# Patient Record
Sex: Female | Born: 1960 | Race: White | Hispanic: No | Marital: Single | Smoking: Never smoker
Health system: Southern US, Community
[De-identification: ages and names within clinical notes are randomized; demographics above are authoritative.]

## PROBLEM LIST (undated history)

## (undated) DIAGNOSIS — E785 Hyperlipidemia, unspecified: Secondary | ICD-10-CM

## (undated) DIAGNOSIS — M797 Fibromyalgia: Secondary | ICD-10-CM

## (undated) DIAGNOSIS — E119 Type 2 diabetes mellitus without complications: Secondary | ICD-10-CM

## (undated) DIAGNOSIS — Z8739 Personal history of other diseases of the musculoskeletal system and connective tissue: Secondary | ICD-10-CM

## (undated) DIAGNOSIS — I10 Essential (primary) hypertension: Secondary | ICD-10-CM

## (undated) HISTORY — PX: CHOLECYSTECTOMY: SHX55

## (undated) HISTORY — PX: DILATION AND CURETTAGE OF UTERUS: SHX78

---

## 2017-08-14 ENCOUNTER — Other Ambulatory Visit: Payer: Self-pay

## 2017-08-14 ENCOUNTER — Encounter: Payer: Self-pay | Admitting: *Deleted

## 2017-08-14 ENCOUNTER — Emergency Department (INDEPENDENT_AMBULATORY_CARE_PROVIDER_SITE_OTHER): Payer: Medicare HMO

## 2017-08-14 ENCOUNTER — Emergency Department (INDEPENDENT_AMBULATORY_CARE_PROVIDER_SITE_OTHER)
Admission: EM | Admit: 2017-08-14 | Discharge: 2017-08-14 | Disposition: A | Discharge status: Home / Self Care | Payer: Medicare HMO | Source: Home / Self Care | Attending: Family Medicine | Admitting: Family Medicine

## 2017-08-14 DIAGNOSIS — M545 Low back pain: Secondary | ICD-10-CM

## 2017-08-14 DIAGNOSIS — M5441 Lumbago with sciatica, right side: Secondary | ICD-10-CM | POA: Diagnosis not present

## 2017-08-14 DIAGNOSIS — M546 Pain in thoracic spine: Secondary | ICD-10-CM

## 2017-08-14 DIAGNOSIS — M2578 Osteophyte, vertebrae: Secondary | ICD-10-CM

## 2017-08-14 DIAGNOSIS — G8929 Other chronic pain: Secondary | ICD-10-CM

## 2017-08-14 DIAGNOSIS — M5442 Lumbago with sciatica, left side: Secondary | ICD-10-CM

## 2017-08-14 DIAGNOSIS — M4310 Spondylolisthesis, site unspecified: Secondary | ICD-10-CM

## 2017-08-14 HISTORY — DX: Type 2 diabetes mellitus without complications: E11.9

## 2017-08-14 HISTORY — DX: Essential (primary) hypertension: I10

## 2017-08-14 HISTORY — DX: Fibromyalgia: M79.7

## 2017-08-14 HISTORY — DX: Hyperlipidemia, unspecified: E78.5

## 2017-08-14 HISTORY — DX: Personal history of other diseases of the musculoskeletal system and connective tissue: Z87.39

## 2017-08-14 MED ORDER — MELOXICAM 7.5 MG PO TABS: 7.5000 mg | ORAL_TABLET | Freq: Every day | ORAL | 0 refills | Status: AC

## 2017-08-14 NOTE — ED Triage Notes (Signed)
Pt c/o severe low to mid back pain x 1 wk after felt a pop when moving bed.

## 2017-08-14 NOTE — Discharge Instructions (Signed)
°  Meloxicam (Mobic) is an antiinflammatory to help with pain and inflammation.  Do not take ibuprofen, Advil, Aleve, or any other medications that contain NSAIDs while taking meloxicam as this may cause stomach upset or even ulcers if taken in large amounts for an extended period of time.   Please follow up with pain management tomorrow as previously scheduled. Please also follow up with family medicine or sports medicine later this week for further evaluation and treatment of lower back pain.

## 2017-08-14 NOTE — ED Provider Notes (Signed)
[facility]    CSN: [id number] Arrival date & time: 08/14/17  1826     History   Chief Complaint Chief Complaint  Patient presents with  . Back Pain    HPI Jennifer Aguirre is a 57 y.o. female.   HPI  Jennifer Aguirre is a 57 y.o. female presenting to UC with c/o severe mid to low back pain that started about 1 week ago after helping a family member move a couch. She felt a "pop" at that time.  Pain is aching, throbbing, worse with certain movements. Radiates down both legs at times. No change in bowel or bladder habits but it is difficult for pt to wipe herself due to the pain when moving.  She does have a hx of chronic back pain and recently moved to the area from [place].  She has been receiving spinal injections. Last one was at the beginning of May.  She had been doing well until moving the couch.  Since then, nothing including her gabapentin, flexeril, tylenol or motrin helps. She has had tramadol in the past but stopped taking that because it was not helping.  She did see her PCP last week for same but was not prescribed narcotic pain medication because pt is in pain management.  She does have a f/u with pain management tomorrow for another injection. Her sister encouraged her to come in tonight to get imaging for pain management.  She has had MRIs in [place] but no new imaging since recent injury moving the couch.     Past Medical History:  Diagnosis Date  . Diabetes mellitus without complication (HCC)   . Fibromyalgia   . H/O degenerative disc disease   . Hyperlipidemia   . Hypertension     There are no active problems to display for this patient.   Past Surgical History:  Procedure Laterality Date  . CHOLECYSTECTOMY    . DILATION AND CURETTAGE OF UTERUS      OB History   None      Home Medications    Prior to Admission medications   Medication Sig Start Date End Date Taking? Authorizing Provider  ALPRAZolam Prudy Feeler(XANAX) 0.5 MG tablet 1-2 po qd prn for  severe anxiety 07/11/17  Yes [provider]  amLODipine (NORVASC) 5 MG tablet Take by mouth. 04/06/17  Yes [provider]  chlorthalidone (HYGROTON) 25 MG tablet TAKE 1 TABLET BY MOUTH EVERY DAY 07/12/17  Yes [provider]  cyclobenzaprine (FLEXERIL) 5 MG tablet Take 5 mg by mouth 3 (three) times daily as needed for muscle spasms.   Yes [provider]  escitalopram (LEXAPRO) 10 MG tablet TAKE 1 TABLET BY MOUTH EVERY DAY 06/18/17  Yes [provider]  gabapentin (NEURONTIN) 600 MG tablet Take by mouth. 08/10/17  Yes [provider]  traMADol (ULTRAM) 50 MG tablet Take by mouth every 6 (six) hours as needed.   Yes [provider]  LATUDA 20 MG TABS tablet  08/08/17   [provider]  losartan (COZAAR) 100 MG tablet Take 100 mg by mouth daily. 06/03/17   [provider]  meloxicam (MOBIC) 7.5 MG tablet Take 1-2 tablets (7.5-15 mg total) by mouth daily. 08/14/17   Lurene ShadowPhelps, Loukisha Gunnerson O, PA-C    Family History Family History  Problem Relation Age of Onset  . Heart disease Mother   . Diabetes Mother   . Stroke Father   . Heart disease Sister   . Heart disease Brother  Social History Social History   Tobacco Use  . Smoking status: Never Smoker  . Smokeless tobacco: Never Used  Substance Use Topics  . Alcohol use: Not Currently    Frequency: Never  . Drug use: Never     Allergies   Penicillin g   Review of Systems Review of Systems  Genitourinary: Negative for decreased urine volume, dysuria, frequency, hematuria and urgency.  Musculoskeletal: Positive for back pain, gait problem (pain with walking) and myalgias.  Skin: Negative for rash.     Physical Exam Triage Vital Signs ED Triage Vitals [08/14/17 1843]  Enc Vitals Group     BP 132/88     Pulse Rate 89     Resp 18     Temp 98.1 F (36.7 C)     Temp Source Oral     SpO2 97 %     Weight 267 lb (121.1 kg)     Height 5\' 2"  (1.575 m)     Head  Circumference      Peak Flow      Pain Score 10     Pain Loc      Pain Edu?      Excl. in GC?    No data found.  Updated Vital Signs BP 132/88 (BP Location: Right Arm)   Pulse 89   Temp 98.1 F (36.7 C) (Oral)   Resp 18   Ht 5\' 2"  (1.575 m)   Wt 267 lb (121.1 kg)   SpO2 97%   BMI 48.83 kg/m   Visual Acuity Right Eye Distance:   Left Eye Distance:   Bilateral Distance:    Right Eye Near:   Left Eye Near:    Bilateral Near:     Physical Exam  Constitutional: She is oriented to person, place, and time. She appears well-developed and well-nourished. No distress.  Obese female sitting in wheelchair, NAD  HENT:  Head: Normocephalic and atraumatic.  Eyes: EOM are normal.  Neck: Normal range of motion.  Cardiovascular: Normal rate and regular rhythm.  Pulmonary/Chest: Effort normal and breath sounds normal. No stridor. No respiratory distress. She has no wheezes. She has no rales.  Musculoskeletal: Normal range of motion. She exhibits tenderness. She exhibits no edema.  Mid thoracic to lower lumbar spine tenderness and tenderness to surrounding muscles. Worse on Right side. Full ROM upper and lower extremities with increased pain standing and ambulating.   Neurological: She is alert and oriented to person, place, and time.  Skin: Skin is warm and dry. She is not diaphoretic.  Psychiatric: She has a normal mood and affect. Her behavior is normal.  Nursing note and vitals reviewed.    UC Treatments / Results  Labs (all labs ordered are listed, but only abnormal results are displayed) Labs Reviewed - No data to display  EKG None  Radiology Dg Thoracic Spine W/swimmers  Result Date: 08/14/2017 CLINICAL DATA:  Thoracic back pain after moving heavy object 1 week ago. Initial encounter. EXAM: THORACIC SPINE - 3 VIEWS COMPARISON:  None. FINDINGS: There is no evidence of thoracic spine fracture. Alignment is normal. Mild vertebral osteophyte formation is seen at multiple  levels in the lower thoracic spine, however there is no evidence of disc space narrowing. No focal lytic sclerotic bone lesions identified. IMPRESSION: No acute findings. Electronically Signed   By: Myles Rosenthal M.D.   On: 08/14/2017 19:47   Dg Lumbar Spine Complete  Result Date: 08/14/2017 CLINICAL DATA:  Low back pain after lifting heavy  object 1 week Initial encounter. EXAM: LUMBAR SPINE - COMPLETE 4+ VIEW COMPARISON:  None. FINDINGS: A transitional lumbosacral vertebra is seen at S1. No evidence of fracture. Mild vertebral osteophyte formation is seen at levels of L1-2 and L2-3, without disc space narrowing. Anterolisthesis noted at S1-2 measuring approximately 10 mm, likely degenerative. No significant facet arthropathy or other bone lesions identified. IMPRESSION: No acute findings. 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 Mild upper lumbar vertebral osteophytosis, without disc space narrowing. Electronically Signed   By: Myles Rosenthal M.D.   On: 08/14/2017 19:50    Procedures Procedures (including critical care time)  Medications Ordered in UC Medications - No data to display  Initial Impression / Assessment and Plan / UC Course  I have reviewed the triage vital signs and the nursing notes.  Pertinent labs & imaging results that were available during my care of the patient were reviewed by me and considered in my medical decision making (see chart for details).     Exacerbation of chronic low back pain with anterolisthesis noted on plain films. Doubt cauda equina or spinal abscess. Will treat conservatively this evening because pt is in pain management.  Home care instructions provided below.   Final Clinical Impressions(s) / UC Diagnoses   Final diagnoses:  Thoracic back pain  Chronic midline low back pain with bilateral sciatica  Anterolisthesis     Discharge Instructions      Meloxicam (Mobic) is an antiinflammatory to help with pain  and inflammation.  Do not take ibuprofen, Advil, Aleve, or any other medications that contain NSAIDs while taking meloxicam as this may cause stomach upset or even ulcers if taken in large amounts for an extended period of time.   Please follow up with pain management tomorrow as previously scheduled. Please also follow up with family medicine or sports medicine later this week for further evaluation and treatment of lower back pain.     ED Prescriptions    Medication Sig Dispense Auth. Provider   meloxicam (MOBIC) 7.5 MG tablet Take 1-2 tablets (7.5-15 mg total) by mouth daily. 30 tablet Lurene Shadow, PA-C     Controlled Substance Prescriptions [place] Controlled Substance Registry consulted? Not Applicable   Rolla Plate 08/16/17 1610

## 2019-11-11 IMAGING — DX DG THORACIC SPINE 3V
3 series · 4 of 4 positions shown · non-contrast
Comparison: None.

CLINICAL DATA: Thoracic back pain after moving heavy object 1 week
ago. Initial encounter.

EXAM:
THORACIC SPINE - 3 VIEWS

[Series 1: t-spine ap · 0.14mm/px · 2 of 2 slices shown]
[im 1/2]
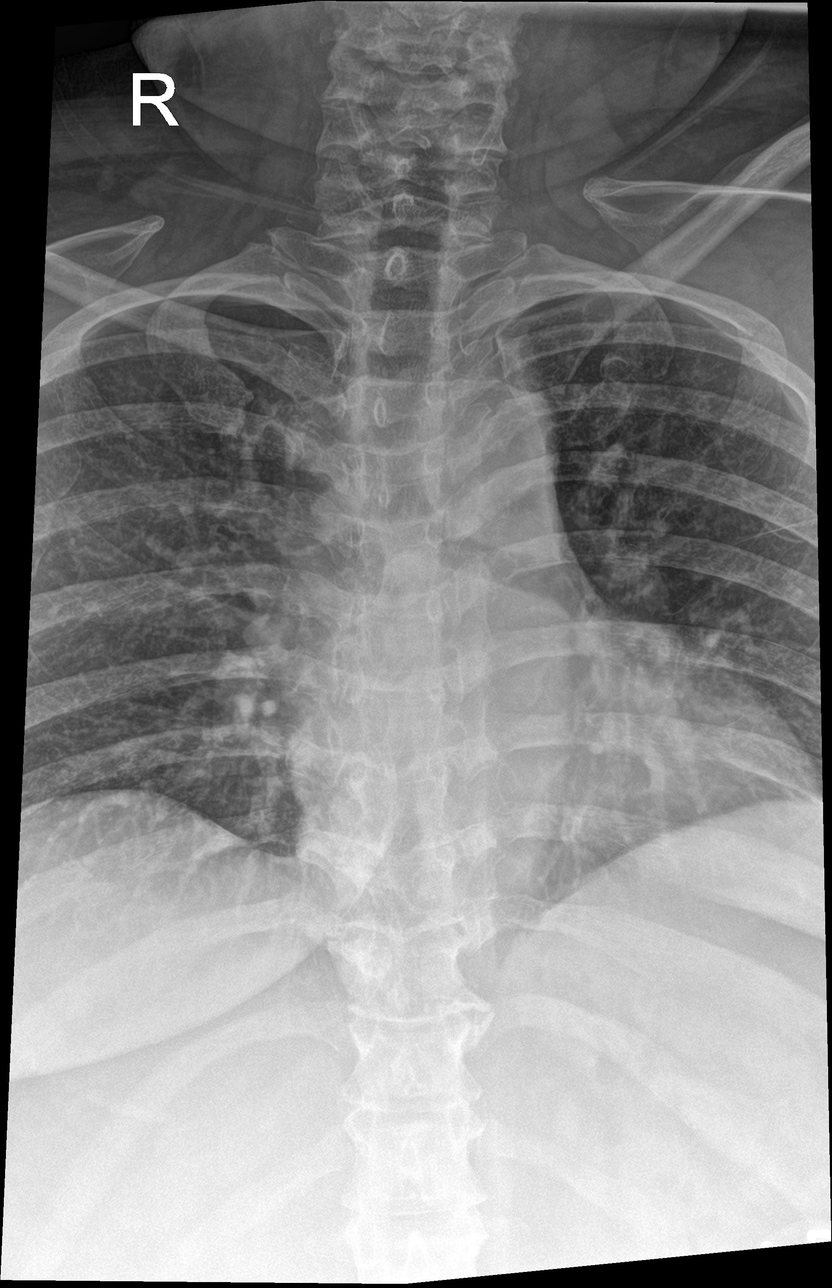
[im 2/2]
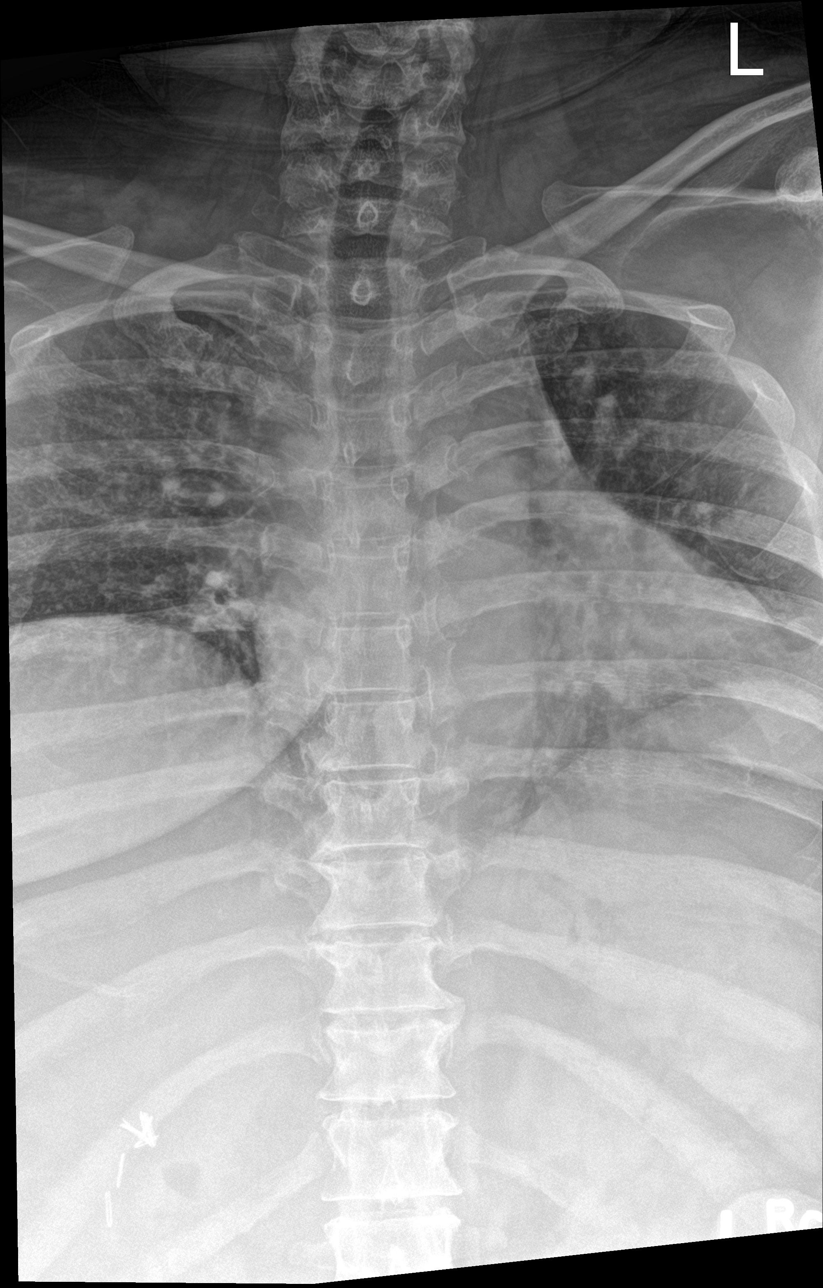

[t-spine lat]
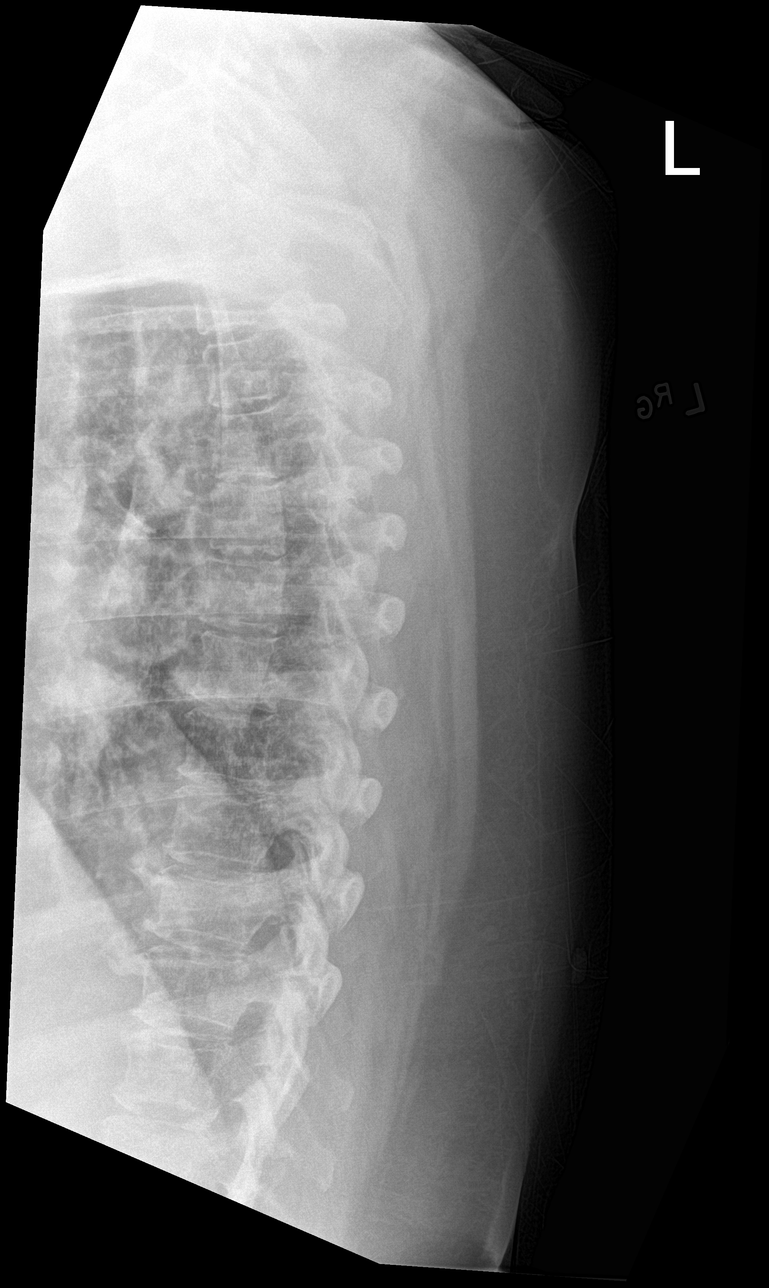

[t-spine swimmers]
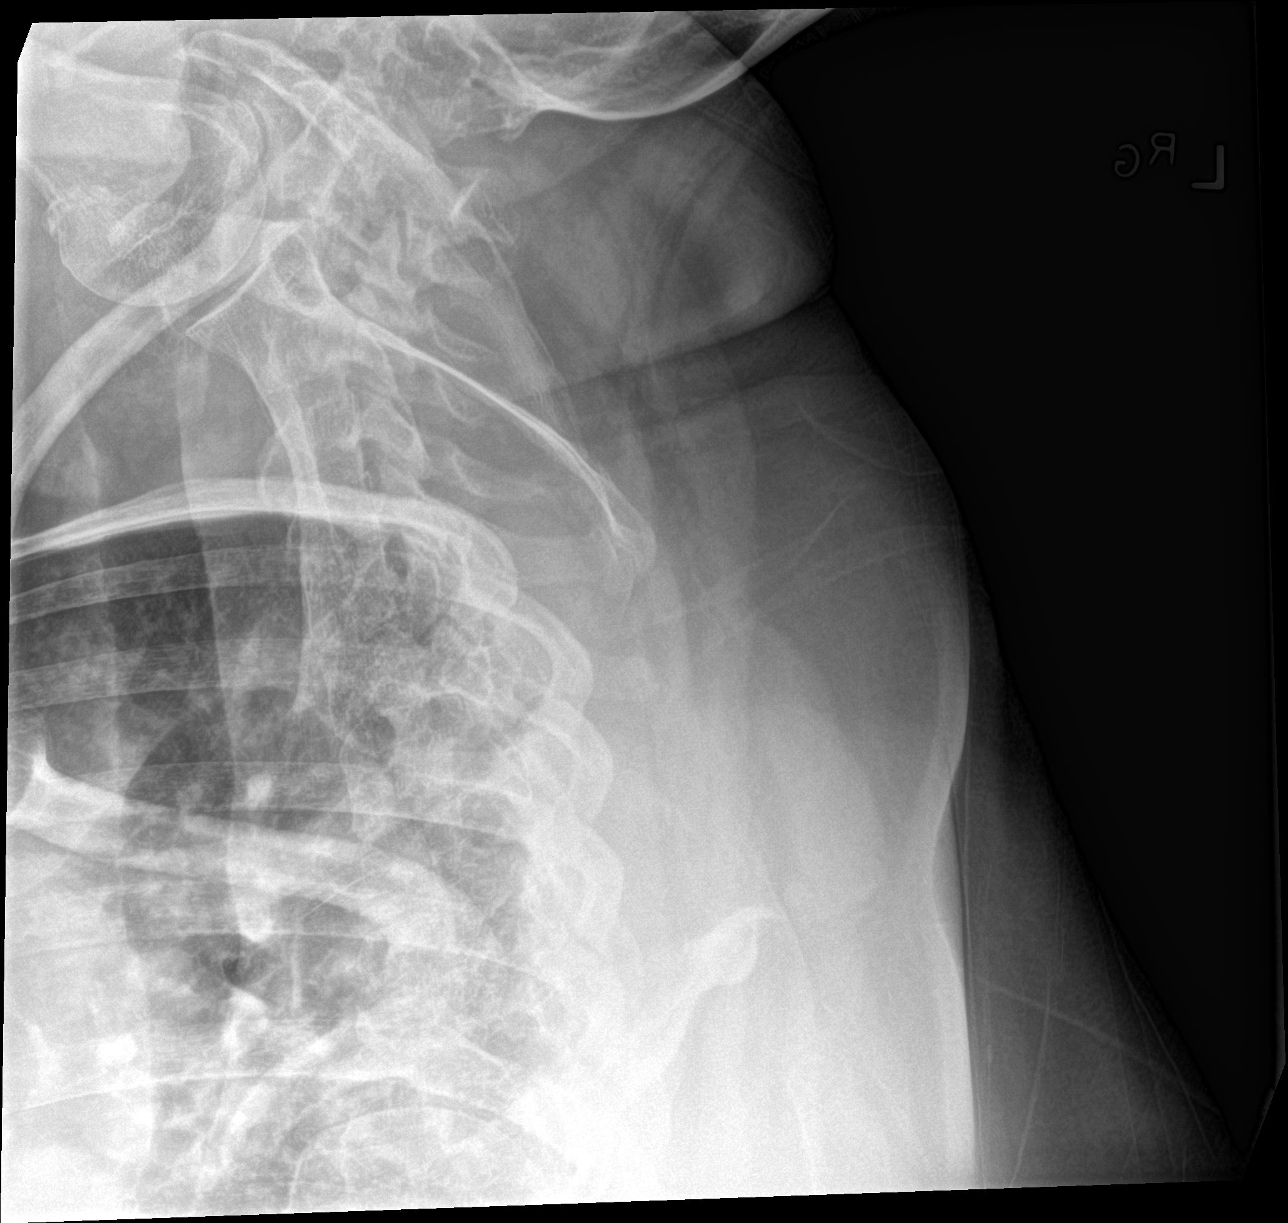

[4 of 4 positions shown; findings below may reference images not displayed]

FINDINGS: There is no evidence of thoracic spine fracture. Alignment is
normal. Mild vertebral osteophyte formation is seen at multiple
levels in the lower thoracic spine, however there is no evidence of
disc space narrowing. No focal lytic sclerotic bone lesions
identified.
IMPRESSION: No acute findings.
